# Patient Record
Sex: Male | Born: 1990 | Race: Black or African American | Hispanic: No | Marital: Single | Smoking: Never smoker
Health system: Southern US, Community
[De-identification: ages and names within clinical notes are randomized; demographics above are authoritative.]

## PROBLEM LIST (undated history)

## (undated) DIAGNOSIS — W3400XA Accidental discharge from unspecified firearms or gun, initial encounter: Secondary | ICD-10-CM

## (undated) DIAGNOSIS — J45909 Unspecified asthma, uncomplicated: Secondary | ICD-10-CM

## (undated) HISTORY — PX: BRAIN SURGERY: SHX531

---

## 2012-04-03 DIAGNOSIS — W3400XA Accidental discharge from unspecified firearms or gun, initial encounter: Secondary | ICD-10-CM

## 2012-04-03 DIAGNOSIS — Y249XXA Unspecified firearm discharge, undetermined intent, initial encounter: Secondary | ICD-10-CM

## 2012-04-03 HISTORY — DX: Unspecified firearm discharge, undetermined intent, initial encounter: Y24.9XXA

## 2012-04-03 HISTORY — DX: Accidental discharge from unspecified firearms or gun, initial encounter: W34.00XA

## 2016-05-27 ENCOUNTER — Emergency Department (HOSPITAL_COMMUNITY)
Admission: EM | Admit: 2016-05-27 | Discharge: 2016-05-27 | Disposition: A | Discharge status: Home / Self Care | Payer: Self-pay | Attending: Emergency Medicine | Admitting: Emergency Medicine

## 2016-05-27 ENCOUNTER — Encounter (HOSPITAL_COMMUNITY): Payer: Self-pay | Admitting: Emergency Medicine

## 2016-05-27 DIAGNOSIS — R002 Palpitations: Secondary | ICD-10-CM | POA: Insufficient documentation

## 2016-05-27 LAB — BASIC METABOLIC PANEL
Anion gap: 12 (ref 5–15)
BUN: 8 mg/dL (ref 6–20)
CALCIUM: 9.9 mg/dL (ref 8.9–10.3)
CO2: 25 mmol/L (ref 22–32)
CREATININE: 0.82 mg/dL (ref 0.61–1.24)
Chloride: 104 mmol/L (ref 101–111)
GFR calc Af Amer: >60 mL/min (ref >60)
Glucose, Bld: 104 mg/dL — ABNORMAL HIGH (ref 65–99)
POTASSIUM: 4.2 mmol/L (ref 3.5–5.1)
Sodium: 141 mmol/L (ref 135–145)

## 2016-05-27 LAB — TROPONIN I

## 2016-05-27 LAB — CBC
HCT: 43 % (ref 39.0–52.0)
Hemoglobin: 14.9 g/dL (ref 13.0–17.0)
MCH: 32.1 pg (ref 26.0–34.0)
MCHC: 34.7 g/dL (ref 30.0–36.0)
MCV: 92.7 fL (ref 78.0–100.0)
PLATELETS: 266 10*3/uL (ref 150–400)
RBC: 4.64 MIL/uL (ref 4.22–5.81)
RDW: 12.1 % (ref 11.5–15.5)
WBC: 6.5 10*3/uL (ref 4.0–10.5)
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
## 2016-05-27 NOTE — ED Notes (Signed)
Pt verbalized understanding discharge instructions and denies any further needs or questions at this time. VS stable, ambulatory and steady gait.   

## 2016-05-30 ENCOUNTER — Emergency Department (HOSPITAL_COMMUNITY): Payer: Self-pay

## 2016-05-30 ENCOUNTER — Encounter (HOSPITAL_COMMUNITY): Payer: Self-pay | Admitting: Emergency Medicine

## 2016-05-30 ENCOUNTER — Emergency Department (HOSPITAL_COMMUNITY)
Admission: EM | Admit: 2016-05-30 | Discharge: 2016-05-31 | Disposition: A | Discharge status: Home / Self Care | Payer: Self-pay | Attending: Physician Assistant | Admitting: Physician Assistant

## 2016-05-30 DIAGNOSIS — J45909 Unspecified asthma, uncomplicated: Secondary | ICD-10-CM | POA: Insufficient documentation

## 2016-05-30 DIAGNOSIS — Z79899 Other long term (current) drug therapy: Secondary | ICD-10-CM | POA: Insufficient documentation

## 2016-05-30 DIAGNOSIS — R200 Anesthesia of skin: Secondary | ICD-10-CM | POA: Insufficient documentation

## 2016-05-30 HISTORY — DX: Unspecified asthma, uncomplicated: J45.909

## 2016-05-30 HISTORY — DX: Accidental discharge from unspecified firearms or gun, initial encounter: W34.00XA

## 2016-05-30 LAB — COMPREHENSIVE METABOLIC PANEL
ALT: 13 U/L — AB (ref 17–63)
AST: 18 U/L (ref 15–41)
Albumin: 5.3 g/dL — ABNORMAL HIGH (ref 3.5–5.0)
Alkaline Phosphatase: 38 U/L (ref 38–126)
Anion gap: 10 (ref 5–15)
BILIRUBIN TOTAL: 0.9 mg/dL (ref 0.3–1.2)
BUN: 8 mg/dL (ref 6–20)
CALCIUM: 9.6 mg/dL (ref 8.9–10.3)
CO2: 26 mmol/L (ref 22–32)
CREATININE: 0.89 mg/dL (ref 0.61–1.24)
Chloride: 105 mmol/L (ref 101–111)
GFR calc Af Amer: >60 mL/min (ref >60)
Glucose, Bld: 97 mg/dL (ref 65–99)
Potassium: 3.2 mmol/L — ABNORMAL LOW (ref 3.5–5.1)
Sodium: 141 mmol/L (ref 135–145)
TOTAL PROTEIN: 8.7 g/dL — AB (ref 6.5–8.1)

## 2016-05-30 LAB — CBC WITH DIFFERENTIAL/PLATELET
BASOS ABS: 0.1 10*3/uL (ref 0.0–0.1)
BASOS PCT: 1 %
EOS ABS: 0 10*3/uL (ref 0.0–0.7)
EOS PCT: 0 %
HCT: 42.8 % (ref 39.0–52.0)
Hemoglobin: 15.3 g/dL (ref 13.0–17.0)
Lymphocytes Relative: 23 %
Lymphs Abs: 1.9 10*3/uL (ref 0.7–4.0)
MCH: 31.9 pg (ref 26.0–34.0)
MCHC: 35.7 g/dL (ref 30.0–36.0)
MCV: 89.2 fL (ref 78.0–100.0)
Monocytes Absolute: 0.3 10*3/uL (ref 0.1–1.0)
Monocytes Relative: 4 %
NEUTROS PCT: 72 %
Neutro Abs: 6.1 10*3/uL (ref 1.7–7.7)
PLATELETS: 249 10*3/uL (ref 150–400)
RBC: 4.8 MIL/uL (ref 4.22–5.81)
RDW: 11.7 % (ref 11.5–15.5)
WBC: 8.4 10*3/uL (ref 4.0–10.5)

## 2016-05-30 LAB — ETHANOL: ALCOHOL ETHYL (B): 207 mg/dL — AB (ref <5)

## 2016-05-30 MED ORDER — SODIUM CHLORIDE 0.9 % IV BOLUS (SEPSIS)
1000.0000 mL | Freq: Once | INTRAVENOUS | Status: AC
  Administered 2016-05-30: 1000 mL via INTRAVENOUS
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
## 2016-05-30 NOTE — ED Notes (Signed)
Pt left the room before MD was able to come and talk with him and he left without discharge paperwork

## 2017-10-21 ENCOUNTER — Emergency Department (HOSPITAL_COMMUNITY)
Admission: EM | Admit: 2017-10-21 | Discharge: 2017-10-21 | Disposition: A | Discharge status: Home / Self Care | Payer: Self-pay | Attending: Emergency Medicine | Admitting: Emergency Medicine

## 2017-10-21 ENCOUNTER — Other Ambulatory Visit: Payer: Self-pay

## 2017-10-21 ENCOUNTER — Encounter (HOSPITAL_COMMUNITY): Payer: Self-pay

## 2017-10-21 ENCOUNTER — Emergency Department (HOSPITAL_COMMUNITY): Payer: Self-pay

## 2017-10-21 DIAGNOSIS — M25532 Pain in left wrist: Secondary | ICD-10-CM

## 2017-10-21 DIAGNOSIS — M25531 Pain in right wrist: Secondary | ICD-10-CM | POA: Insufficient documentation

## 2017-10-21 DIAGNOSIS — J45909 Unspecified asthma, uncomplicated: Secondary | ICD-10-CM | POA: Insufficient documentation

## 2017-10-21 DIAGNOSIS — Z79899 Other long term (current) drug therapy: Secondary | ICD-10-CM | POA: Insufficient documentation

## 2017-10-21 MED ORDER — IBUPROFEN 200 MG PO TABS
600.0000 mg | ORAL_TABLET | Freq: Once | ORAL | Status: AC
  Administered 2017-10-21: 600 mg via ORAL
  Filled 2017-10-21: qty 3

## 2017-10-21 MED ORDER — NAPROXEN 500 MG PO TABS: 500.0000 mg | ORAL_TABLET | Freq: Two times a day (BID) | ORAL | 0 refills | Status: AC

## 2017-10-21 NOTE — ED Notes (Signed)
Ice applied to L wrist

## 2017-10-21 NOTE — Discharge Instructions (Addendum)
You were seen here today for wrist pain. Your exam is consistent with carpal tunnel syndrome.  You were given wrist splints in the department.  Please follow attached instructions.  Please follow up with hand specialist in 1 week.  Take Naproxen as needed for pain. Please take with food.  If you develop worsening or new concerning symptoms you can return to the emergency department for re-evaluation.

## 2017-10-21 NOTE — ED Triage Notes (Signed)
He c/o pain in both right and left wrists x ~ 2 weeks. He also c/o occasional paresthesias of his hand. 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 He is in no distress. He states his sx are a bit worse on the left.
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
## 2017-10-24 ENCOUNTER — Other Ambulatory Visit: Payer: Self-pay

## 2017-10-24 ENCOUNTER — Encounter (HOSPITAL_COMMUNITY): Payer: Self-pay

## 2017-10-24 ENCOUNTER — Emergency Department (HOSPITAL_COMMUNITY)
Admission: EM | Admit: 2017-10-24 | Discharge: 2017-10-24 | Disposition: A | Discharge status: Home / Self Care | Payer: Self-pay | Attending: Emergency Medicine | Admitting: Emergency Medicine

## 2017-10-24 DIAGNOSIS — Z202 Contact with and (suspected) exposure to infections with a predominantly sexual mode of transmission: Secondary | ICD-10-CM | POA: Insufficient documentation

## 2017-10-24 DIAGNOSIS — J45909 Unspecified asthma, uncomplicated: Secondary | ICD-10-CM | POA: Insufficient documentation

## 2017-10-24 DIAGNOSIS — R369 Urethral discharge, unspecified: Secondary | ICD-10-CM | POA: Insufficient documentation

## 2017-10-24 MED ORDER — AZITHROMYCIN 250 MG PO TABS
1000.0000 mg | ORAL_TABLET | Freq: Once | ORAL | Status: AC
  Administered 2017-10-24: 1000 mg via ORAL
  Filled 2017-10-24: qty 4

## 2017-10-24 MED ORDER — CEFTRIAXONE SODIUM 250 MG IJ SOLR
250.0000 mg | Freq: Once | INTRAMUSCULAR | Status: AC
  Administered 2017-10-24: 250 mg via INTRAMUSCULAR
  Filled 2017-10-24: qty 250

## 2017-10-24 MED ORDER — LIDOCAINE HCL 1 % IJ SOLN
INTRAMUSCULAR | Status: AC
  Administered 2017-10-24: 20 mL
  Filled 2017-10-24: qty 20

## 2017-10-24 NOTE — ED Triage Notes (Signed)
Pt states that he noticed clear discharge from his penis this morning. Pt states unprotected sex on Sunday night.
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
## 2017-10-24 NOTE — ED Notes (Signed)
Bed: WTR8 Expected date:  Expected time:  Means of arrival:  Comments: 

## 2017-10-24 NOTE — Discharge Instructions (Signed)
Please read and follow all provided instructions.  Your diagnoses today include:  1. Penile discharge   2. Possible exposure to STD     Tests performed today include:  Test for gonorrhea and chlamydia.   Vital signs. See below for your results today.   Medications:  You were treated with azithromycin and rocephin today. These antibiotics treat you for gonorrhea and chlamydia. They do not treat for HIV or syphilis.   Home care instructions:  Read educational materials contained in this packet and follow any instructions provided.   You should tell your partners about your infection and avoid having sex for one week to allow time for the medicine to work.  Sexually transmitted disease testing also available at:   [facility] [place]  [address], [place], phone [phone] or [phone]    Monday - Friday, call for an appointment  [facility] [place]  [address], [place], phone [phone] or [phone]   Monday - Friday, call for an appointment  Return instructions:   Please return to the Emergency Department if you experience worsening symptoms.   Please return if you have any other emergent concerns.  Additional Information:  Your vital signs today were: BP (!) 142/86 (BP Location: Right Arm)    Pulse 94    Temp 98.4 F (36.9 C) (Oral)    Resp 15    Ht 5\' 6"  (1.676 m)    Wt 74.8 kg (165 lb)    SpO2 100%    BMI 26.63 kg/m  If your blood pressure (BP) was elevated above 135/85 this visit, please have this repeated by your doctor within one month. --------------

## 2017-10-25 LAB — GC/CHLAMYDIA PROBE AMP (~~LOC~~) NOT AT ARMC
Chlamydia: NEGATIVE
NEISSERIA GONORRHEA: NEGATIVE

## 2017-11-02 ENCOUNTER — Encounter: Payer: Self-pay | Admitting: Neurology

## 2017-11-06 ENCOUNTER — Other Ambulatory Visit: Payer: Self-pay

## 2017-11-06 ENCOUNTER — Emergency Department (HOSPITAL_COMMUNITY)
Admission: EM | Admit: 2017-11-06 | Discharge: 2017-11-06 | Disposition: A | Discharge status: Home / Self Care | Payer: Self-pay | Attending: Emergency Medicine | Admitting: Emergency Medicine

## 2017-11-06 ENCOUNTER — Encounter (HOSPITAL_COMMUNITY): Payer: Self-pay

## 2017-11-06 DIAGNOSIS — R369 Urethral discharge, unspecified: Secondary | ICD-10-CM | POA: Insufficient documentation

## 2017-11-06 DIAGNOSIS — Z5321 Procedure and treatment not carried out due to patient leaving prior to being seen by health care provider: Secondary | ICD-10-CM | POA: Insufficient documentation

## 2017-11-06 NOTE — ED Triage Notes (Signed)
Patient c/o a white penile discharge since this AM. Patient states he was treated a couple of weeks ago, but not for sure if his girlfriend was treated. Patient reports that he had sex again with significant other 4 days ago.

## 2017-11-07 NOTE — ED Notes (Signed)
Follow up call made  No answer  11/07/17  0820  s Korrin Waterfield rn

## 2017-11-22 ENCOUNTER — Other Ambulatory Visit: Payer: Self-pay | Admitting: Neurology

## 2017-11-22 ENCOUNTER — Encounter: Payer: Self-pay | Admitting: Neurology

## 2017-11-22 DIAGNOSIS — R202 Paresthesia of skin: Secondary | ICD-10-CM

## 2020-03-15 IMAGING — CR DG WRIST COMPLETE 3+V*R*
4 series · 4 of 4 positions shown · non-contrast
Comparison: None.

CLINICAL DATA: Bilateral wrist pain for the past 2 weeks.
Repetitive lifting.

EXAM:
RIGHT WRIST - COMPLETE 3+ VIEW

[x wrist pa right]
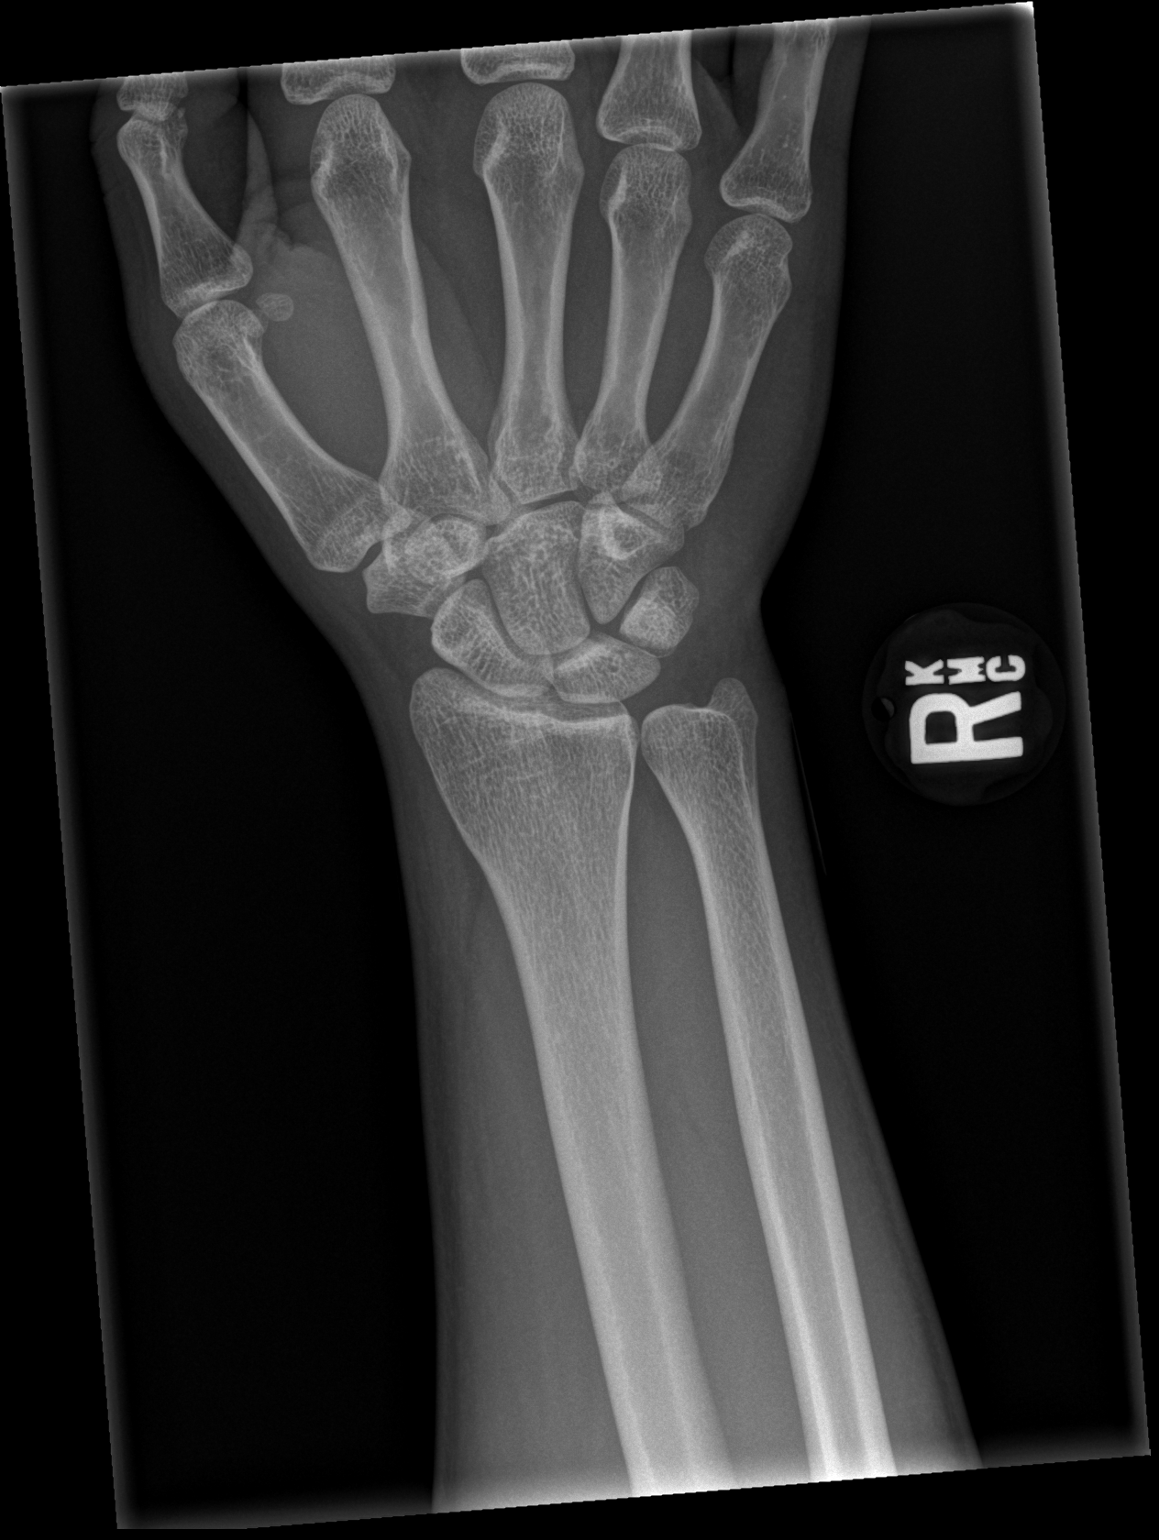

[x wrist obl right]
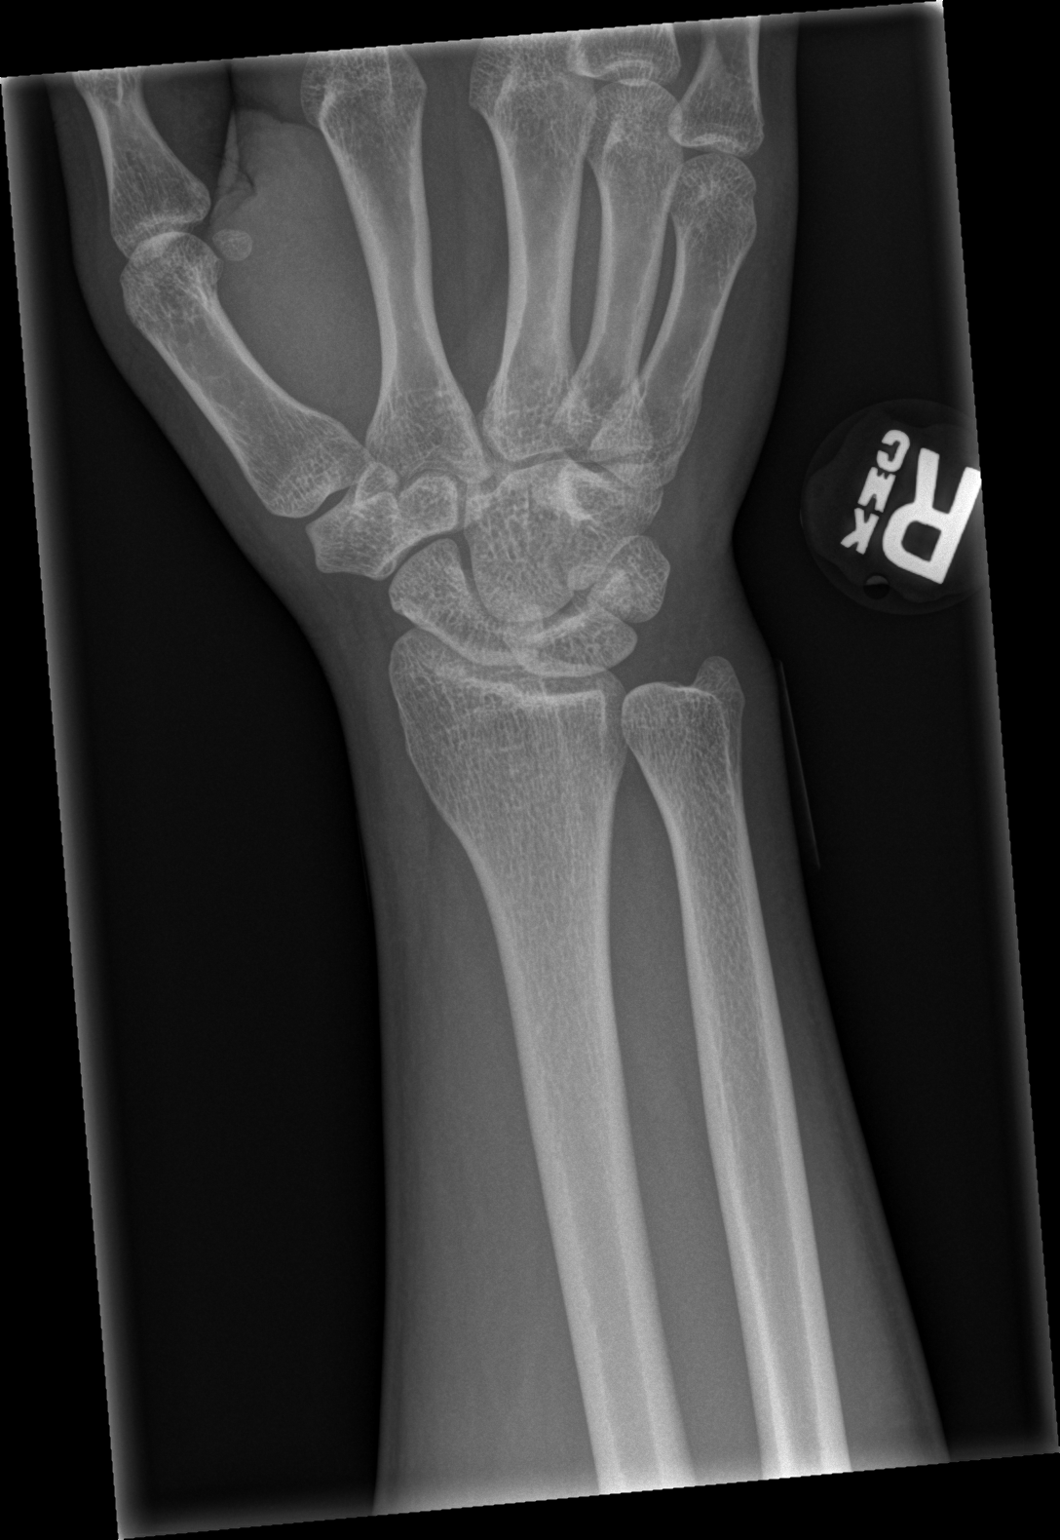

[x wrist lat right]
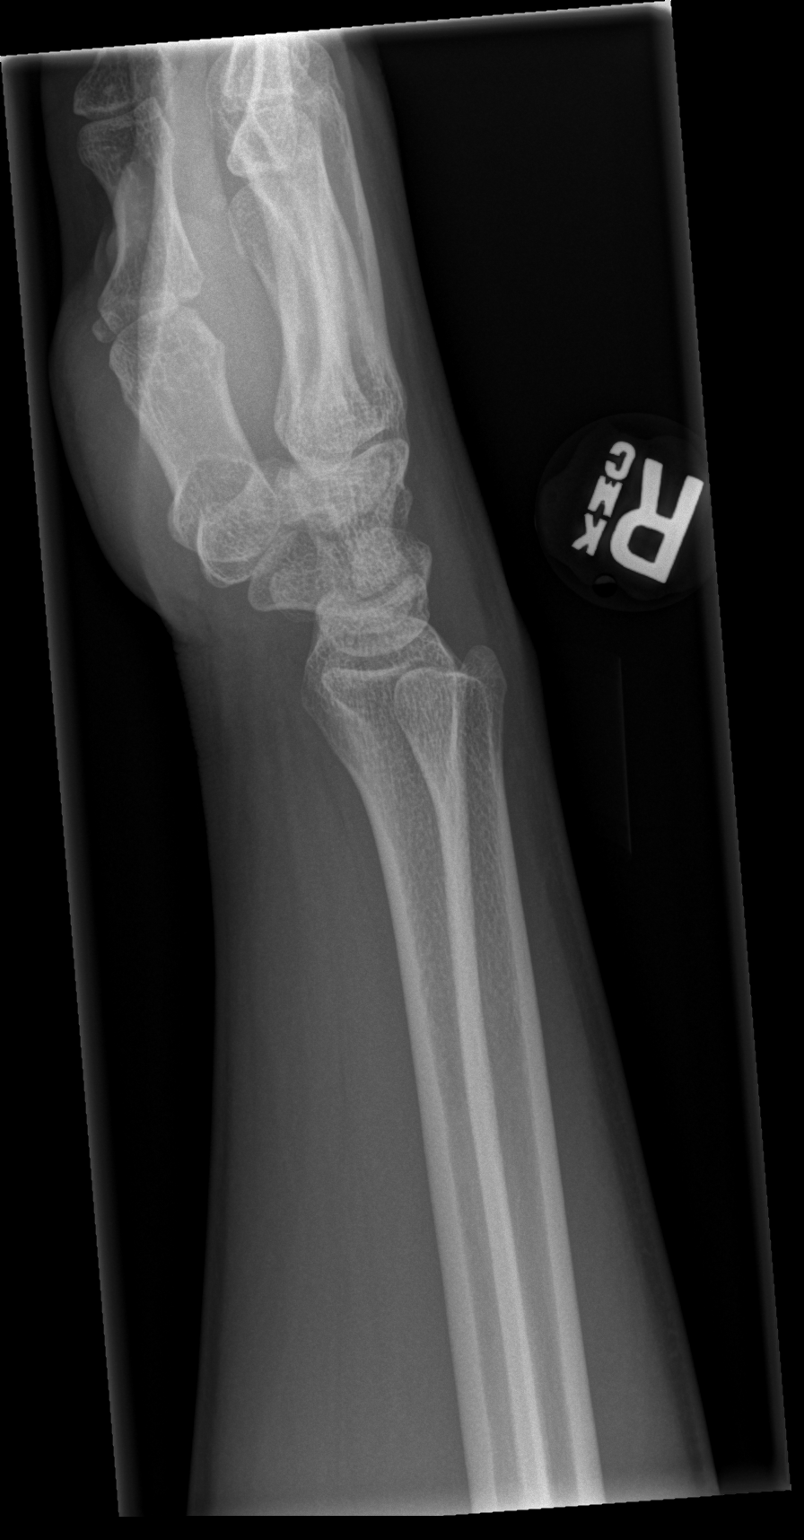

[x wrist navicular view right]
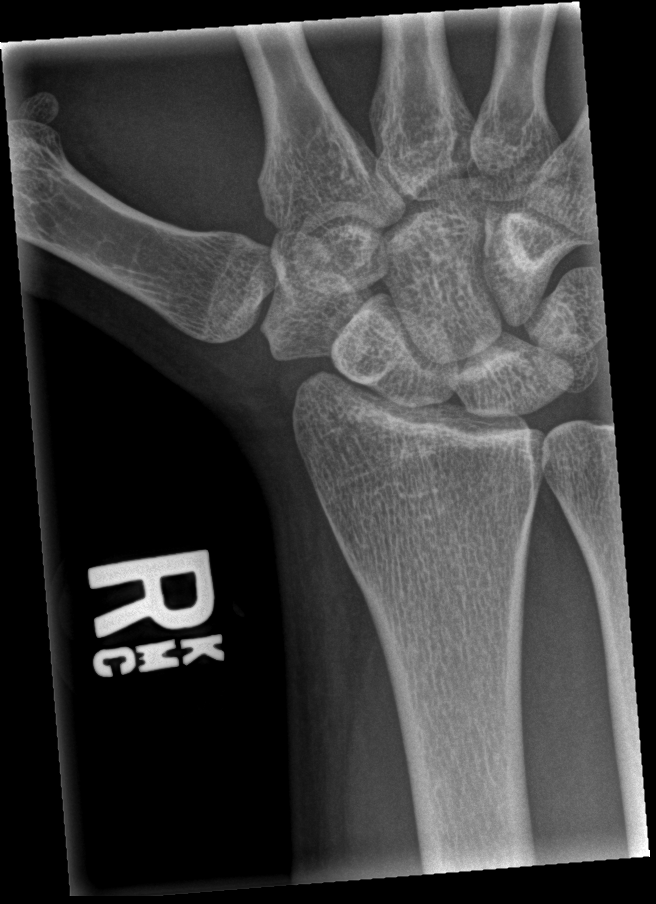

[4 of 4 positions shown; findings below may reference images not displayed]

FINDINGS: There is no evidence of fracture or dislocation. There is no
evidence of arthropathy or other focal bone abnormality. Soft
tissues are unremarkable.
IMPRESSION: Normal examination.
